# Patient Record
Sex: Female | Born: 1960 | Race: Black or African American | Hispanic: No | Marital: Single | Smoking: Never smoker
Health system: Southern US, Community
[De-identification: ages and names within clinical notes are randomized; demographics above are authoritative.]

## PROBLEM LIST (undated history)

## (undated) DIAGNOSIS — F419 Anxiety disorder, unspecified: Secondary | ICD-10-CM

## (undated) DIAGNOSIS — I209 Angina pectoris, unspecified: Secondary | ICD-10-CM

## (undated) DIAGNOSIS — E7800 Pure hypercholesterolemia, unspecified: Secondary | ICD-10-CM

## (undated) DIAGNOSIS — I10 Essential (primary) hypertension: Secondary | ICD-10-CM

## (undated) DIAGNOSIS — E119 Type 2 diabetes mellitus without complications: Secondary | ICD-10-CM

## (undated) HISTORY — PX: APPENDECTOMY: SHX54

---

## 2016-02-17 ENCOUNTER — Encounter (HOSPITAL_BASED_OUTPATIENT_CLINIC_OR_DEPARTMENT_OTHER): Payer: Self-pay | Admitting: Emergency Medicine

## 2016-02-17 DIAGNOSIS — Z8659 Personal history of other mental and behavioral disorders: Secondary | ICD-10-CM | POA: Insufficient documentation

## 2016-02-17 DIAGNOSIS — E119 Type 2 diabetes mellitus without complications: Secondary | ICD-10-CM | POA: Insufficient documentation

## 2016-02-17 DIAGNOSIS — J301 Allergic rhinitis due to pollen: Secondary | ICD-10-CM | POA: Insufficient documentation

## 2016-02-17 DIAGNOSIS — I10 Essential (primary) hypertension: Secondary | ICD-10-CM | POA: Insufficient documentation

## 2016-02-17 NOTE — ED Notes (Signed)
Patient states "i feel like my head is going to blow". Reports that her head is all stopped up and she is unable to breath. The patient is coughing with watery eyes and itching.

## 2016-02-18 ENCOUNTER — Emergency Department (HOSPITAL_BASED_OUTPATIENT_CLINIC_OR_DEPARTMENT_OTHER)
Admission: EM | Admit: 2016-02-18 | Discharge: 2016-02-18 | Disposition: A | Discharge status: Home / Self Care | Payer: Self-pay | Attending: Emergency Medicine | Admitting: Emergency Medicine

## 2016-02-18 ENCOUNTER — Emergency Department (HOSPITAL_BASED_OUTPATIENT_CLINIC_OR_DEPARTMENT_OTHER): Payer: Self-pay

## 2016-02-18 DIAGNOSIS — J302 Other seasonal allergic rhinitis: Secondary | ICD-10-CM

## 2016-02-18 DIAGNOSIS — R05 Cough: Secondary | ICD-10-CM

## 2016-02-18 DIAGNOSIS — R059 Cough, unspecified: Secondary | ICD-10-CM

## 2016-02-18 HISTORY — DX: Angina pectoris, unspecified: I20.9

## 2016-02-18 HISTORY — DX: Anxiety disorder, unspecified: F41.9

## 2016-02-18 HISTORY — DX: Type 2 diabetes mellitus without complications: E11.9

## 2016-02-18 HISTORY — DX: Pure hypercholesterolemia, unspecified: E78.00

## 2016-02-18 HISTORY — DX: Essential (primary) hypertension: I10

## 2016-02-18 LAB — CBG MONITORING, ED: Glucose-Capillary: 85 mg/dL (ref 65–99)

## 2016-02-18 MED ORDER — CETIRIZINE HCL 10 MG PO TABS: 10.0000 mg | ORAL_TABLET | Freq: Every day | ORAL | Status: AC

## 2016-02-18 MED ORDER — KETOTIFEN FUMARATE 0.025 % OP SOLN: 1.0000 [drp] | Freq: Two times a day (BID) | OPHTHALMIC | Status: AC

## 2016-02-18 MED ORDER — TRIAMCINOLONE ACETONIDE 55 MCG/ACT NA AERO: 2.0000 | INHALATION_SPRAY | Freq: Every day | NASAL | Status: AC

## 2016-02-18 NOTE — ED Provider Notes (Signed)
CSN: [id number]     Arrival date & time 02/17/16  2015 History   First MD Initiated Contact with Patient 02/18/16 0008     Chief Complaint  Patient presents with  . Nasal Congestion     (Consider location/radiation/quality/duration/timing/severity/associated sxs/prior Treatment) HPI  Social 32, female, who presents emergency Department with chief complaint of nasal congestion and cough. She has a past medical history of morbid obesity, diabetes, hypertension. [facility]. She complains of facial congestion, itchy, watery eyes, itchy ears, cough ongoing for several months. Patient states that at night. She sometimes has difficulty breathing. She expresses symptoms of PND. She states that her friends told her to come get a chest x-ray. She denies fevers or chills. Patient states she has not been taking her medications because she has no income and cannot buy any over-the-counter medications. The patient denies chest pain, nausea, vomiting, exertional dyspnea.  Past Medical History  Diagnosis Date  . Hypertension   . Hypercholesteremia   . Anxiety   . Diabetes mellitus without complication (HCC)   . Anginal pain [facility])    Past Surgical History  Procedure Laterality Date  . Cesarean section    . Appendectomy     History reviewed. No pertinent family history. Social History  Substance Use Topics  . Smoking status: Never Smoker   . Smokeless tobacco: None  . Alcohol Use: Yes     Comment: occ   OB History    No data available     Review of Systems   Ten systems reviewed and are negative for acute change, except as noted in the HPI.  H Allergies  Lisinopril and Other  Home Medications   Prior to Admission medications   Not on File   BP 162/118 mmHg  Pulse 90  Temp(Src) 99.3 F (37.4 C) (Oral)  Resp 18  Ht  (1.473 m)  Wt 109.317 kg  BMI 50.38 kg/m2  SpO2 100% Physical Exam  Constitutional: She is oriented to person,  place, and time. She appears well-developed and well-nourished. No distress.  HENT:  Head: Normocephalic and atraumatic.  Eyes: Conjunctivae and EOM are normal. Pupils are equal, round, and reactive to light. No scleral icterus.  ALLERGIC SHINERS, transvrse nasal crease  Neck: Normal range of motion.  Cardiovascular: Normal rate, regular rhythm and normal heart sounds.  Exam reveals no gallop and no friction rub.   No murmur heard. Pulmonary/Chest: Effort normal and breath sounds normal. No respiratory distress. She has no rales.  Abdominal: Soft. Bowel sounds are normal. She exhibits no distension and no mass. There is no tenderness. There is no guarding.  Neurological: She is alert and oriented to person, place, and time.  Skin: Skin is warm and dry. She is not diaphoretic.    ED Course  Procedures (including critical care time) Labs Review Labs Reviewed  CBG MONITORING, ED    Imaging Review Dg Chest 2 View  02/18/2016  CLINICAL DATA:  Cough EXAM: CHEST  2 VIEW COMPARISON:  None. FINDINGS: Borderline cardiomegaly. The aorta is tortuous. Borderline hyperinflation. Congested appearance of the central vessels. There is no edema, consolidation, effusion, or pneumothorax. IMPRESSION: Borderline pulmonary venous congestion. Electronically Signed   By: Marnee Spring M.D.   On: 02/18/2016 00:25   I have personally reviewed and evaluated these images and lab results as part of my medical decision-making.   EKG Interpretation None      MDM   Final diagnoses:  None  Patient's chest x-ray shows some borderline pulmonary venous congestion. I question whether she is having PND versus obstructive sleep apnea, considering her morbid obesity and hypertension. Patient is followed at the [facility] will need close follow-up with her physician. She ambulated in the emergency department with oxygen saturations above 90%. Patient will be discharged with cetirizine,  ketotifen-fumarate eyedrops, and Nasacort nasal spray. CBG is 85. She appears safe for discharge at this time. I doubt any infectious etiology of her symptoms such as pneumonia.    Arthor CaptainAbigail Janeal Abadi, PA-C 02/18/16 0123  Shon Batonourtney F Horton, MD 02/18/16 [phone]

## 2016-02-18 NOTE — ED Notes (Signed)
CBG is 85. ?

## 2016-02-18 NOTE — ED Notes (Signed)
Pt ambulated around dept. Sats remained between 97% - 93%. Pt talking to this RT while walking and was in no distress.

## 2017-10-11 IMAGING — CR DG CHEST 2V
2 series · 2 of 2 positions shown · non-contrast
Comparison: None.

CLINICAL DATA: Cough

EXAM:
CHEST  2 VIEW

[w chest pa *]
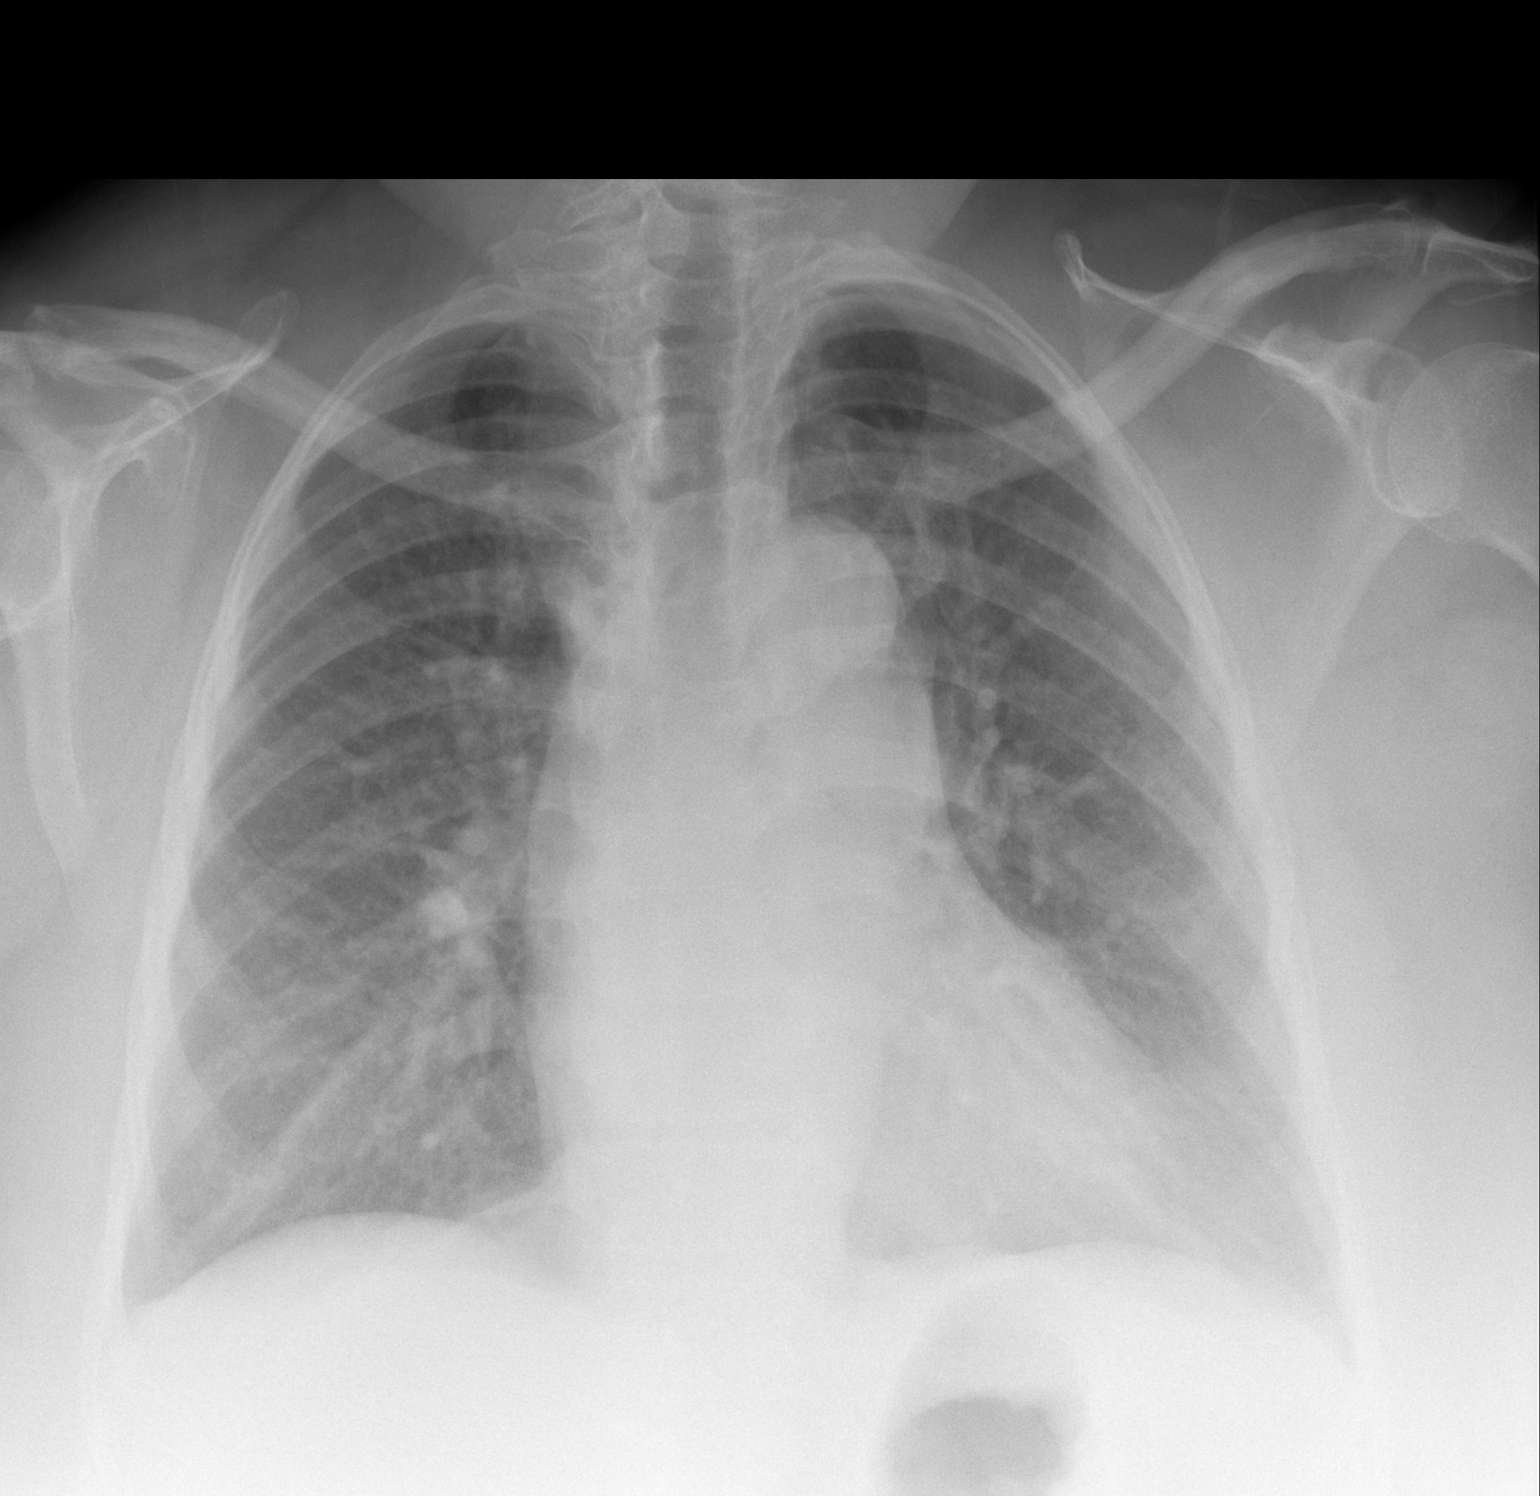

[w chest lat]
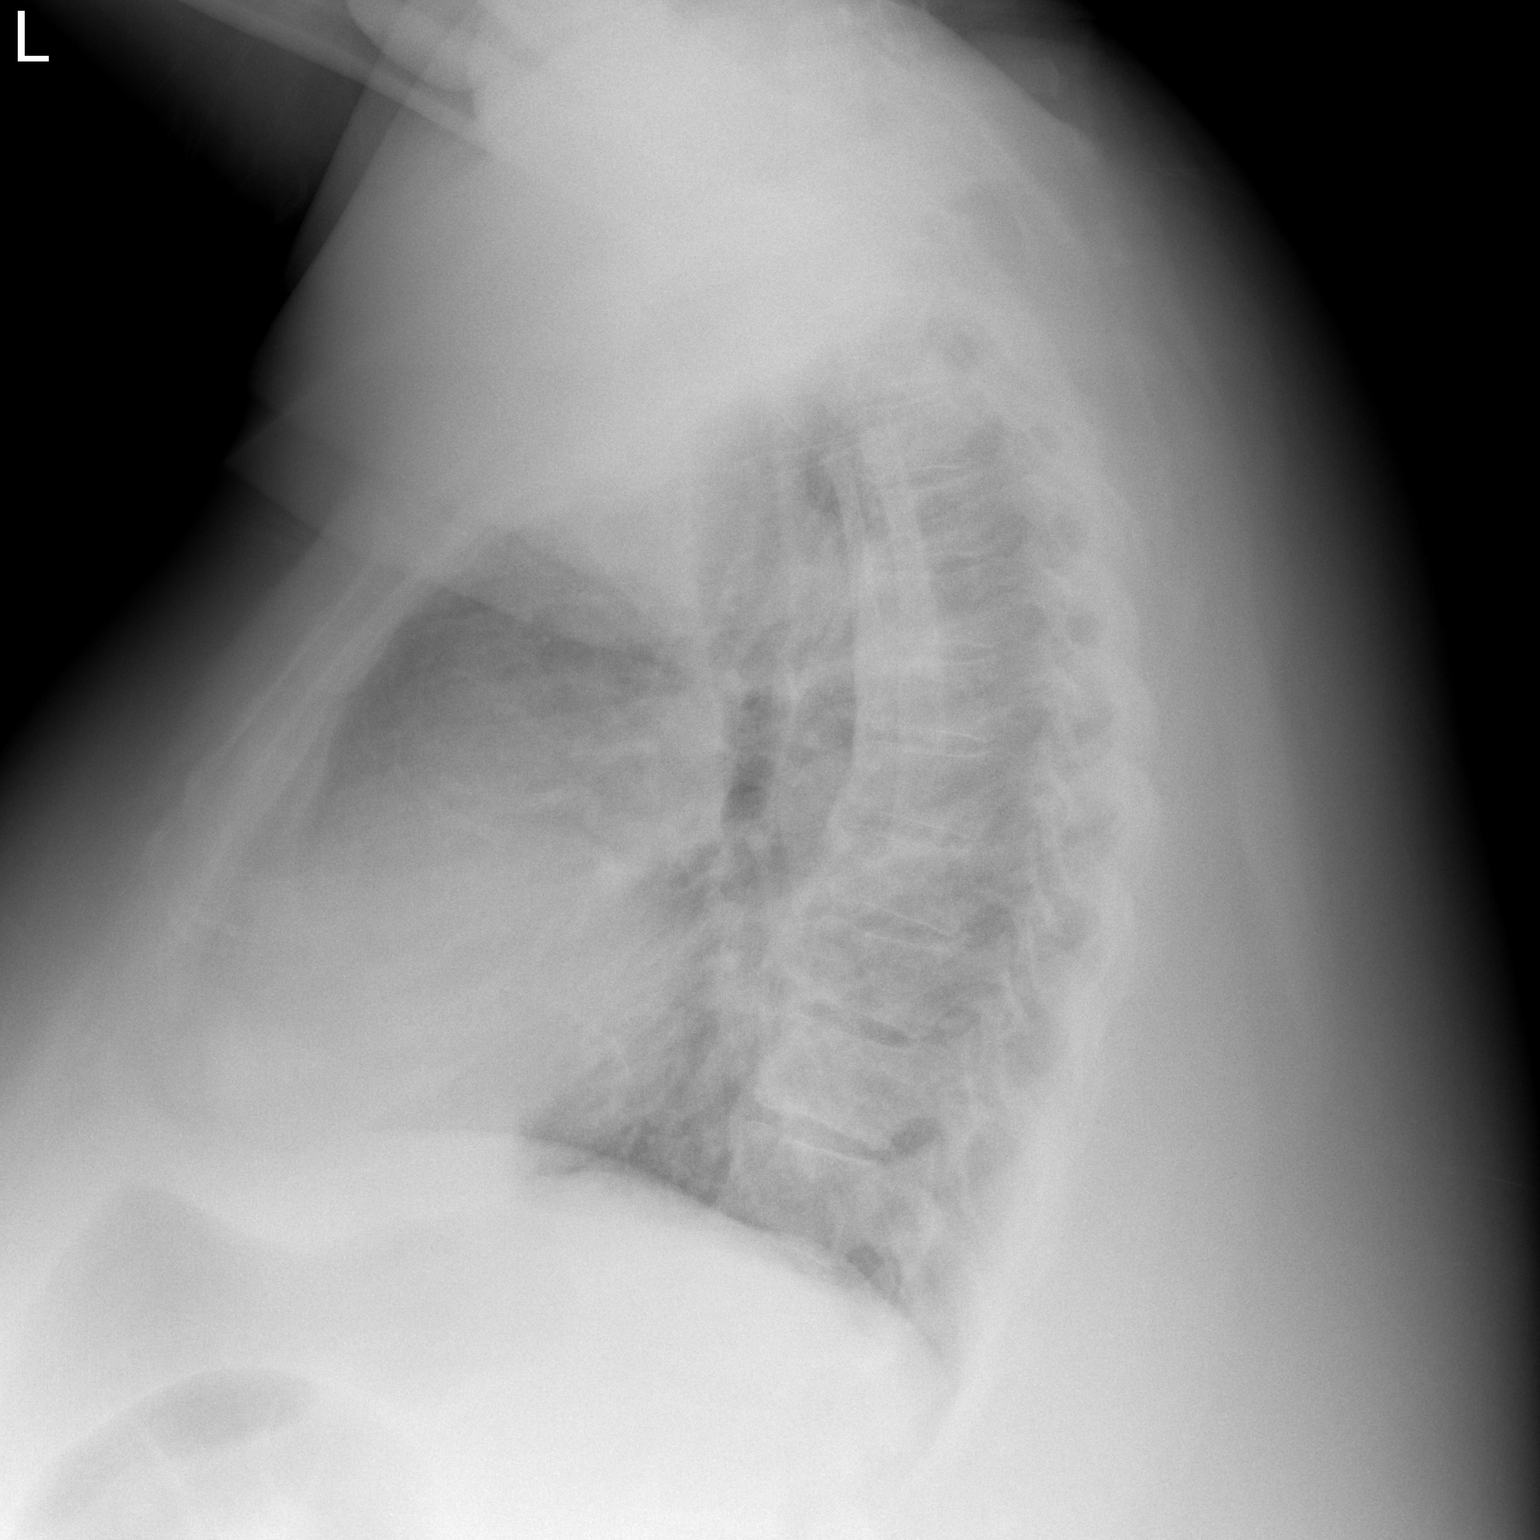

[2 of 2 positions shown; findings below may reference images not displayed]

FINDINGS: Borderline cardiomegaly. The aorta is tortuous. Borderline
hyperinflation. Congested appearance of the central vessels. There
is no edema, consolidation, effusion, or pneumothorax.
IMPRESSION: Borderline pulmonary venous congestion.
